# Patient Record
Sex: Female | Born: 1980 | Race: Black or African American | Hispanic: No | Marital: Single | Smoking: Current some day smoker
Health system: Southern US, Community
[De-identification: ages and names within clinical notes are randomized; demographics above are authoritative.]

## PROBLEM LIST (undated history)

## (undated) DIAGNOSIS — D869 Sarcoidosis, unspecified: Secondary | ICD-10-CM

---

## 2018-06-10 ENCOUNTER — Emergency Department (HOSPITAL_COMMUNITY): Payer: Self-pay

## 2018-06-10 ENCOUNTER — Encounter (HOSPITAL_COMMUNITY): Payer: Self-pay

## 2018-06-10 ENCOUNTER — Emergency Department (HOSPITAL_COMMUNITY)
Admission: EM | Admit: 2018-06-10 | Discharge: 2018-06-10 | Disposition: A | Discharge status: Home / Self Care | Payer: Self-pay | Attending: Emergency Medicine | Admitting: Emergency Medicine

## 2018-06-10 DIAGNOSIS — Y999 Unspecified external cause status: Secondary | ICD-10-CM | POA: Insufficient documentation

## 2018-06-10 DIAGNOSIS — S161XXA Strain of muscle, fascia and tendon at neck level, initial encounter: Secondary | ICD-10-CM

## 2018-06-10 DIAGNOSIS — Y939 Activity, unspecified: Secondary | ICD-10-CM | POA: Insufficient documentation

## 2018-06-10 DIAGNOSIS — Y929 Unspecified place or not applicable: Secondary | ICD-10-CM | POA: Insufficient documentation

## 2018-06-10 DIAGNOSIS — S0181XA Laceration without foreign body of other part of head, initial encounter: Secondary | ICD-10-CM | POA: Insufficient documentation

## 2018-06-10 DIAGNOSIS — F17200 Nicotine dependence, unspecified, uncomplicated: Secondary | ICD-10-CM | POA: Insufficient documentation

## 2018-06-10 HISTORY — DX: Sarcoidosis, unspecified: D86.9

## 2018-06-10 LAB — POC URINE PREG, ED: Preg Test, Ur: NEGATIVE

## 2018-06-10 MED ORDER — CYCLOBENZAPRINE HCL 10 MG PO TABS: 10.0000 mg | ORAL_TABLET | Freq: Two times a day (BID) | ORAL | 0 refills | Status: AC | PRN

## 2018-06-10 MED ORDER — IBUPROFEN 600 MG PO TABS: 600.0000 mg | ORAL_TABLET | Freq: Four times a day (QID) | ORAL | 0 refills | Status: AC | PRN
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
